# Patient Record
Sex: Female | Born: 1965 | Race: Asian | Hispanic: No | Marital: Married | Smoking: Never smoker
Health system: Southern US, Community
[De-identification: ages and names within clinical notes are randomized; demographics above are authoritative.]

## PROBLEM LIST (undated history)

## (undated) DIAGNOSIS — I10 Essential (primary) hypertension: Secondary | ICD-10-CM

---

## 2007-02-18 ENCOUNTER — Ambulatory Visit: Payer: Self-pay | Admitting: Internal Medicine

## 2007-02-18 LAB — CONVERTED CEMR LAB
ALT: 16 units/L (ref 0–35)
Albumin: 4 g/dL (ref 3.5–5.2)
Alkaline Phosphatase: 51 units/L (ref 39–117)
BUN: 12 mg/dL (ref 6–23)
Bilirubin Urine: NEGATIVE
Calcium: 9 mg/dL (ref 8.4–10.5)
Creatinine, Ser: 0.6 mg/dL (ref 0.4–1.2)
Direct LDL: 135.8 mg/dL
Eosinophils Absolute: 0 10*3/uL (ref 0.0–0.6)
HCT: 36.6 % (ref 36.0–46.0)
HDL: 52.9 mg/dL (ref >39.0)
Ketones, ur: NEGATIVE mg/dL
Leukocytes, UA: NEGATIVE
Monocytes Absolute: 0.4 10*3/uL (ref 0.2–0.7)
Monocytes Relative: 7 % (ref 3.0–11.0)
Neutro Abs: 3.7 10*3/uL (ref 1.4–7.7)
Neutrophils Relative %: 57.7 % (ref 43.0–77.0)
Nitrite: NEGATIVE
RDW: 13.9 % (ref 11.5–14.6)
Sodium: 137 meq/L (ref 135–145)
Specific Gravity, Urine: 1.02 (ref 1.000–1.03)
TSH: 0.87 microintl units/mL (ref 0.35–5.50)
Total CHOL/HDL Ratio: 3.8
Total Protein, Urine: NEGATIVE mg/dL
Triglycerides: 82 mg/dL (ref 0–149)
Urobilinogen, UA: 0.2 (ref 0.0–1.0)
WBC: 6.2 10*3/uL (ref 4.5–10.5)
pH: 6.5 (ref 5.0–8.0)

## 2007-02-25 ENCOUNTER — Ambulatory Visit: Payer: Self-pay | Admitting: Internal Medicine

## 2007-02-25 ENCOUNTER — Encounter (INDEPENDENT_AMBULATORY_CARE_PROVIDER_SITE_OTHER): Payer: Self-pay | Admitting: *Deleted

## 2007-02-25 DIAGNOSIS — I10 Essential (primary) hypertension: Secondary | ICD-10-CM | POA: Insufficient documentation

## 2007-03-01 ENCOUNTER — Ambulatory Visit (HOSPITAL_COMMUNITY): Admission: RE | Admit: 2007-03-01 | Discharge: 2007-03-01 | Payer: Self-pay | Admitting: Internal Medicine

## 2007-03-13 ENCOUNTER — Ambulatory Visit: Payer: Self-pay | Admitting: Internal Medicine

## 2007-03-19 LAB — CONVERTED CEMR LAB: Pap Smear: NORMAL

## 2007-03-22 LAB — CONVERTED CEMR LAB
CO2: 32 meq/L (ref 19–32)
Calcium: 9.3 mg/dL (ref 8.4–10.5)
Chloride: 102 meq/L (ref 96–112)
Creatinine, Ser: 0.6 mg/dL (ref 0.4–1.2)
GFR calc non Af Amer: 117 mL/min
Glucose, Bld: 94 mg/dL (ref 70–99)
Sodium: 141 meq/L (ref 135–145)

## 2007-03-25 ENCOUNTER — Ambulatory Visit: Payer: Self-pay | Admitting: Internal Medicine

## 2007-03-26 ENCOUNTER — Encounter: Payer: Self-pay | Admitting: Internal Medicine

## 2007-03-28 ENCOUNTER — Ambulatory Visit: Payer: Self-pay

## 2011-05-24 ENCOUNTER — Ambulatory Visit: Payer: Self-pay | Admitting: Internal Medicine

## 2012-12-31 ENCOUNTER — Other Ambulatory Visit: Payer: Self-pay | Admitting: Internal Medicine

## 2012-12-31 DIAGNOSIS — Z1231 Encounter for screening mammogram for malignant neoplasm of breast: Secondary | ICD-10-CM

## 2013-01-28 ENCOUNTER — Ambulatory Visit
Admission: RE | Admit: 2013-01-28 | Discharge: 2013-01-28 | Disposition: A | Discharge status: Home / Self Care | Payer: Medicaid Other | Source: Ambulatory Visit | Attending: Internal Medicine | Admitting: Internal Medicine

## 2013-01-28 DIAGNOSIS — Z1231 Encounter for screening mammogram for malignant neoplasm of breast: Secondary | ICD-10-CM

## 2014-03-30 ENCOUNTER — Other Ambulatory Visit: Payer: Self-pay | Admitting: Family Medicine

## 2014-03-30 DIAGNOSIS — Z1231 Encounter for screening mammogram for malignant neoplasm of breast: Secondary | ICD-10-CM

## 2014-04-06 ENCOUNTER — Ambulatory Visit (HOSPITAL_COMMUNITY)
Admission: RE | Admit: 2014-04-06 | Discharge: 2014-04-06 | Disposition: A | Discharge status: Home / Self Care | Payer: Medicaid Other | Source: Ambulatory Visit | Attending: Family Medicine | Admitting: Family Medicine

## 2014-04-06 ENCOUNTER — Ambulatory Visit (HOSPITAL_COMMUNITY): Payer: Medicaid Other

## 2014-04-06 DIAGNOSIS — Z1231 Encounter for screening mammogram for malignant neoplasm of breast: Secondary | ICD-10-CM | POA: Diagnosis not present

## 2016-12-03 ENCOUNTER — Encounter: Payer: Self-pay | Admitting: *Deleted

## 2016-12-03 DIAGNOSIS — Z23 Encounter for immunization: Secondary | ICD-10-CM

## 2018-09-20 ENCOUNTER — Other Ambulatory Visit: Payer: Self-pay | Admitting: Family Medicine

## 2018-09-20 DIAGNOSIS — Z1231 Encounter for screening mammogram for malignant neoplasm of breast: Secondary | ICD-10-CM

## 2018-11-24 ENCOUNTER — Encounter: Payer: Self-pay | Admitting: *Deleted

## 2018-11-24 ENCOUNTER — Other Ambulatory Visit: Payer: Self-pay | Admitting: *Deleted

## 2018-11-29 ENCOUNTER — Other Ambulatory Visit: Payer: Self-pay

## 2018-11-29 ENCOUNTER — Ambulatory Visit
Admission: RE | Admit: 2018-11-29 | Discharge: 2018-11-29 | Disposition: A | Discharge status: Home / Self Care | Payer: Medicaid Other | Source: Ambulatory Visit | Attending: Family Medicine | Admitting: Family Medicine

## 2018-11-29 DIAGNOSIS — Z1231 Encounter for screening mammogram for malignant neoplasm of breast: Secondary | ICD-10-CM

## 2019-02-15 ENCOUNTER — Emergency Department (HOSPITAL_BASED_OUTPATIENT_CLINIC_OR_DEPARTMENT_OTHER): Payer: Medicaid Other

## 2019-02-15 ENCOUNTER — Encounter (HOSPITAL_BASED_OUTPATIENT_CLINIC_OR_DEPARTMENT_OTHER): Payer: Self-pay | Admitting: Emergency Medicine

## 2019-02-15 ENCOUNTER — Emergency Department (HOSPITAL_BASED_OUTPATIENT_CLINIC_OR_DEPARTMENT_OTHER)
Admission: EM | Admit: 2019-02-15 | Discharge: 2019-02-15 | Disposition: A | Discharge status: Home / Self Care | Payer: Medicaid Other | Attending: Emergency Medicine | Admitting: Emergency Medicine

## 2019-02-15 ENCOUNTER — Other Ambulatory Visit: Payer: Self-pay

## 2019-02-15 DIAGNOSIS — S52124A Nondisplaced fracture of head of right radius, initial encounter for closed fracture: Secondary | ICD-10-CM | POA: Diagnosis not present

## 2019-02-15 DIAGNOSIS — Y939 Activity, unspecified: Secondary | ICD-10-CM | POA: Insufficient documentation

## 2019-02-15 DIAGNOSIS — I10 Essential (primary) hypertension: Secondary | ICD-10-CM | POA: Diagnosis not present

## 2019-02-15 DIAGNOSIS — Y999 Unspecified external cause status: Secondary | ICD-10-CM | POA: Insufficient documentation

## 2019-02-15 DIAGNOSIS — Y929 Unspecified place or not applicable: Secondary | ICD-10-CM | POA: Insufficient documentation

## 2019-02-15 DIAGNOSIS — W108XXA Fall (on) (from) other stairs and steps, initial encounter: Secondary | ICD-10-CM | POA: Insufficient documentation

## 2019-02-15 DIAGNOSIS — S59801A Other specified injuries of right elbow, initial encounter: Secondary | ICD-10-CM | POA: Diagnosis present

## 2019-02-15 HISTORY — DX: Essential (primary) hypertension: I10

## 2019-02-15 NOTE — ED Provider Notes (Signed)
[facility] EMERGENCY DEPARTMENT Provider Note   CSN: [id number] Arrival date & time: 02/15/19  1415     History   Chief Complaint Chief Complaint  Patient presents with   Fall    HPI Linda Holloway is a 53 y.o. female.     Patient presents to the emergency department today with complaint of right elbow pain and swelling.  Patient fell yesterday after missing a step and landed on a flexed right elbow.  Patient also sustained a mild abrasion to her knees but is able to walk and move well.  She developed worsening pain and swelling to the right elbow after the fall.  This prompted visit today.  No pain medications prior to arrival.  No distal numbness or tingling of the right hand or wrist.  No shoulder pain.  She did not hit her head and does not have any neck pain.  Onset of symptoms acute.  Pain is worse with movement.     Past Medical History:  Diagnosis Date   Hypertension     Patient Active Problem List   Diagnosis Date Noted   HYPERTENSION 02/25/2007    Past Surgical History:  Procedure Laterality Date   CESAREAN SECTION       OB History   No obstetric history on file.      Home Medications    Prior to Admission medications   Not on File    Family History History reviewed. No pertinent family history.  Social History Social History   Tobacco Use   Smoking status: Never Smoker  Substance Use Topics   Alcohol use: Not on file   Drug use: Not on file     Allergies   Patient has no allergy information on record.   Review of Systems Review of Systems  Constitutional: Negative for activity change.  Musculoskeletal: Positive for arthralgias and joint swelling. Negative for back pain, gait problem and neck pain.  Skin: Negative for wound.  Neurological: Negative for weakness and numbness.     Physical Exam Updated Vital Signs BP (!) 151/103 (BP Location: Left Arm)    Pulse (!) 102    Temp 99.2 F (37.3 C)    Resp 16    Ht 4\' 9"   (1.448 m)    Wt 58 kg    LMP 03/05/2014 Comment: states she is not pregnant   SpO2 100%    BMI 27.67 kg/m   Physical Exam Vitals signs and nursing note reviewed.  Constitutional:      Appearance: She is well-developed.  HENT:     Head: Normocephalic and atraumatic.  Eyes:     Pupils: Pupils are equal, round, and reactive to light.  Neck:     Musculoskeletal: Normal range of motion and neck supple.  Cardiovascular:     Pulses: Normal pulses. No decreased pulses.          Radial pulses are 2+ on the right side and 2+ on the left side.  Musculoskeletal:        General: Tenderness present.     Right shoulder: Normal.     Right elbow: She exhibits decreased range of motion and effusion. Tenderness found. Radial head tenderness noted. No medial epicondyle, no lateral epicondyle and no olecranon process tenderness noted.     Right wrist: Normal. She exhibits normal range of motion and no tenderness.       Arms:     Right hand: Normal sensation noted. Normal strength noted.  Skin:  General: Skin is warm and dry.  Neurological:     Mental Status: She is alert.     Sensory: No sensory deficit.     Comments: Motor, sensation, and vascular distal to the injury is fully intact.       ED Treatments / Results  Labs (all labs ordered are listed, but only abnormal results are displayed) Labs Reviewed - No data to display  EKG None  Radiology Dg Elbow Complete Right  Result Date: 02/15/2019 CLINICAL DATA:  Larey Seat yesterday on chronic treat injuring RIGHT elbow, posterior/olecranon pain EXAM: RIGHT ELBOW - COMPLETE 3+ VIEW COMPARISON:  None FINDINGS: Osseous mineralization normal. Joint spaces preserved. Minimally displaced intra-articular fracture of the RIGHT radial head. Associated moderate-sized RIGHT elbow joint effusion/hemarthrosis. No additional fracture, dislocation, or bone destruction. IMPRESSION: Minimally displaced intra-articular fracture of the RIGHT radial head associated  with moderate joint effusion/hemarthrosis. Electronically Signed   By: Ulyses Southward M.D.   On: 02/15/2019 14:59    Procedures Procedures (including critical care time)  Medications Ordered in ED Medications - No data to display   Initial Impression / Assessment and Plan / ED Course  I have reviewed the triage vital signs and the nursing notes.  Pertinent labs & imaging results that were available during my care of the patient were reviewed by me and considered in my medical decision making (see chart for details).        Patient seen and examined.  X-ray obtained prior to my evaluation.  This demonstrated radial head fracture.  Patient placed in a sling.  She has primary care doctor.  Encouraged patient to follow-up with PCP and or orthopedic referral.  Encouraged use of NSAIDs or Tylenol for pain.  Also encouraged rice protocol and patient has been icing her elbow in the room.  Vital signs reviewed and are as follows: BP (!) 142/78 (BP Location: Left Arm)    Pulse 89    Temp 99 F (37.2 C) (Oral)    Resp 16    Ht 4\' 9"  (1.448 m)    Wt 58 kg    LMP 03/05/2014 Comment: states she is not pregnant   SpO2 100%    BMI 27.67 kg/m     Final Clinical Impressions(s) / ED Diagnoses   Final diagnoses:  Closed nondisplaced fracture of head of right radius, initial encounter   Patient with minimally displaced right radial head fracture sustained after a fall yesterday.  No other significant injuries.  No signs of compartment syndrome or nerve compromise in the forearm, wrist, or hand.  Patient placed in a sling for comfort and will follow up with orthopedics or her primary care doctor to ensure proper healing.  ED Discharge Orders    None       03/07/2014, Renne Crigler 02/15/19 1624    14/05/20, MD 02/15/19 2229

## 2019-02-15 NOTE — ED Triage Notes (Signed)
Pts husband reports pt fell last night after missing a step and injured right elbow. Pt also c/o bilateral knee pain/abrasions

## 2019-02-15 NOTE — Discharge Instructions (Signed)
Please read and follow all provided instructions.  Your diagnoses today include:  1. Closed nondisplaced fracture of head of right radius, initial encounter     Tests performed today include:  An x-ray of the affected area - shows a closed radial head fracture  Vital signs. See below for your results today.   Medications prescribed:  Please use over-the-counter NSAID medications (ibuprofen, naproxen) or Tylenol (acetaminophen) as directed on the packaging for pain.   Take any prescribed medications only as directed.  Home care instructions:   Follow any educational materials contained in this packet  Follow R.I.C.E. Protocol:  R - rest your injury   I  - use ice on injury without applying directly to skin  C - compress injury with bandage or splint  E - elevate the injury as much as possible  Follow-up instructions: You should follow-up with either your primary care doctor or an orthopedist (referral listed -Dr. Lorin Mercy) next week to ensure appropriate wound healing.  Please use the sling as needed for comfort.  Return instructions:   Please return if your fingers are numb or tingling, appear gray or blue, or you have severe pain (also elevate the arm and loosen splint or wrap if you were given one)  Please return to the Emergency Department if you experience worsening symptoms.   Please return if you have any other emergent concerns.  Additional Information:  Your vital signs today were: BP (!) 175/95 (BP Location: Left Arm)    Pulse (!) 112    Temp 99.2 F (37.3 C)    Ht 4\' 9"  (1.448 m)    Wt 58 kg    LMP 03/05/2014 Comment: states she is not pregnant   SpO2 98%    BMI 27.67 kg/m  If your blood pressure (BP) was elevated above 135/85 this visit, please have this repeated by your doctor within one month. --------------

## 2019-02-18 ENCOUNTER — Ambulatory Visit (INDEPENDENT_AMBULATORY_CARE_PROVIDER_SITE_OTHER): Payer: Medicaid Other

## 2019-02-18 ENCOUNTER — Other Ambulatory Visit: Payer: Self-pay

## 2019-02-18 ENCOUNTER — Encounter: Payer: Self-pay | Admitting: Orthopaedic Surgery

## 2019-02-18 ENCOUNTER — Ambulatory Visit (INDEPENDENT_AMBULATORY_CARE_PROVIDER_SITE_OTHER): Payer: Medicaid Other | Admitting: Orthopaedic Surgery

## 2019-02-18 VITALS — BP 154/95 | HR 101 | Ht <= 58 in | Wt 127.0 lb

## 2019-02-18 DIAGNOSIS — S52121A Displaced fracture of head of right radius, initial encounter for closed fracture: Secondary | ICD-10-CM | POA: Diagnosis not present

## 2019-02-18 DIAGNOSIS — S52123A Displaced fracture of head of unspecified radius, initial encounter for closed fracture: Secondary | ICD-10-CM | POA: Insufficient documentation

## 2019-02-18 DIAGNOSIS — M25531 Pain in right wrist: Secondary | ICD-10-CM | POA: Diagnosis not present

## 2019-02-18 MED ORDER — TRAMADOL HCL 50 MG PO TABS: 50.0000 mg | ORAL_TABLET | Freq: Four times a day (QID) | ORAL | 0 refills | Status: AC | PRN

## 2019-02-18 NOTE — Progress Notes (Signed)
Office Visit Note   Patient: Linda Holloway           Date of Birth: 1966/03/11           MRN: [id number] Visit Date: 02/18/2019              Requested by: Shawna Orleans, Doe-Hyun R, DO No address on file PCP: Rosine Abe, DO   Assessment & Plan: Visit Diagnoses:  1. Pain in right wrist   2. Closed displaced fracture of head of right radius, initial encounter     Plan: X-rays of the wrist were negative.  We were discussed with her that she has some referred pain from her radial head fracture.  Continue sling we discussed she may be more comfortable sleeping in recliner.  Ultram sent in for pain that she can take in addition ibuprofen.  Recheck 2weeks.  We went over flexion-extension elbow exercises she can gradually work on.  No surgery indicated at this time.  Repeat x-rays in 2 weeks.  Follow-Up Instructions: Return in about 2 weeks (around 03/04/2019).   Orders:  Orders Placed This Encounter  Procedures  . XR Wrist Complete Right   Meds ordered this encounter  Medications  . traMADol (ULTRAM) 50 MG tablet    Sig: Take 1 tablet (50 mg total) by mouth every 6 (six) hours as needed.    Dispense:  30 tablet    Refill:  0      Procedures: No procedures performed   Clinical Data: No additional findings.   Subjective: Chief Complaint  Patient presents with  . Right Elbow - Fracture    DOI 02/14/2019    HPI 53 year old female missed a step on 02/14/2019 landing on her elbow with pain and minimally displaced radial head fracture, closed.  Involved one third of the head 1 mm depression.  She has been in the sling and has had some referred pain down to the wrist.  She is used Tylenol once she does not have any pain medication.  Patient is here with her husband who is speaking for her.  Extra time was spent going over exercise that she could do for her elbow for passive assistive range of motion using her opposite hand on her wrist.  We will make a decision about possible formal therapy  only if she needs it we will recheck her again in 2 weeks.  Review of Systems review of systems positive for hypertension, high cholesterol otherwise negative.  No past history of injury to her elbow no chills or fever no other injuries when she fell she denied loss of consciousness.   Objective: Vital Signs: BP (!) 154/95   Pulse (!) 101   Ht 4\' 9"  (1.448 m)   Wt 127 lb (57.6 kg)   LMP 03/05/2014 Comment: states she is not pregnant  BMI 27.48 kg/m   Physical Exam Constitutional:      Appearance: She is well-developed.  HENT:     Head: Normocephalic.     Right Ear: External ear normal.     Left Ear: External ear normal.  Eyes:     Pupils: Pupils are equal, round, and reactive to light.  Neck:     Thyroid: No thyromegaly.     Trachea: No tracheal deviation.  Cardiovascular:     Rate and Rhythm: Normal rate.  Pulmonary:     Effort: Pulmonary effort is normal.  Abdominal:     Palpations: Abdomen is soft.  Skin:    General: Skin  is warm and dry.  Neurological:     Mental Status: She is alert and oriented to person, place, and time.  Psychiatric:        Behavior: Behavior normal.     Ortho Exam right elbow shows elbow effusion.  She has supination pronation with discomfort.  60 degrees range of motion flexion extension with pain at extremes range of motion.  Elbow effusion is noted laterally and pain with palpation of the radial head as expected.  Specialty Comments:  No specialty comments available.  Imaging: Xr Wrist Complete Right  Result Date: 02/18/2019 Three-view x-rays right wrist obtained AP lateral scaphoid view.  This is negative for acute injury. Impression: Normal right wrist x-rays.    PMFS History: Patient Active Problem List   Diagnosis Date Noted  . Radial head fracture, closed 02/18/2019  . HYPERTENSION 02/25/2007   Past Medical History:  Diagnosis Date  . Hypertension     No family history on file.  Past Surgical History:  Procedure  Laterality Date  . CESAREAN SECTION     Social History   Occupational History  . Not on file  Tobacco Use  . Smoking status: Never Smoker  . Smokeless tobacco: Never Used  Substance and Sexual Activity  . Alcohol use: Not on file  . Drug use: Not on file  . Sexual activity: Not on file

## 2019-02-19 ENCOUNTER — Telehealth: Payer: Self-pay | Admitting: Radiology

## 2019-02-19 NOTE — Telephone Encounter (Signed)
Submitted prior auth to [employer] for Tramadol   Form HelpConfirmation #:[id number] [employer] Approval #:Status:SUSPENDED Thank you. Your request has been successfully submitted.  Will check back for authorization.

## 2019-02-21 NOTE — Telephone Encounter (Signed)
Attempt to recheck [facility]. Portal down.

## 2019-02-24 NOTE — Telephone Encounter (Signed)
I called patient, no answer. Will address at follow up appt unless she calls back.

## 2019-02-24 NOTE — Telephone Encounter (Signed)
I called [facility] this morning to see if patient received her medication. No answer, no voicemail picked up. Will try again.

## 2019-03-04 ENCOUNTER — Encounter: Payer: Self-pay | Admitting: Orthopaedic Surgery

## 2019-03-04 ENCOUNTER — Ambulatory Visit: Payer: Self-pay

## 2019-03-04 ENCOUNTER — Ambulatory Visit (INDEPENDENT_AMBULATORY_CARE_PROVIDER_SITE_OTHER): Payer: Medicaid Other | Admitting: Orthopaedic Surgery

## 2019-03-04 ENCOUNTER — Other Ambulatory Visit: Payer: Self-pay

## 2019-03-04 VITALS — BP 150/95 | HR 106 | Ht <= 58 in | Wt 127.0 lb

## 2019-03-04 DIAGNOSIS — M25511 Pain in right shoulder: Secondary | ICD-10-CM

## 2019-03-04 DIAGNOSIS — S52121A Displaced fracture of head of right radius, initial encounter for closed fracture: Secondary | ICD-10-CM | POA: Diagnosis not present

## 2019-03-04 NOTE — Progress Notes (Signed)
Office Visit Note   Patient: Linda Holloway           Date of Birth: 03-Nov-1965           MRN: [id number] Visit Date: 03/04/2019              Requested by: Artist Pais, Doe-Hyun R, DO No address on file PCP: Meda Coffee, DO   Assessment & Plan: Visit Diagnoses:  1. Closed displaced fracture of head of right radius, initial encounter   2. Acute pain of right shoulder     Plan: Post shoulder injury with some early adhesive capsulitis.  We will set up for some physical therapy to work on range of motion active assistive range of motion.  We discussed lying down using a stick in both hands trying to gradually work her arm overhead use of a pulley at home on a rope over the top of the door.  Active assistive range of motion using her opposite arm to help stretch her shoulder.  At this point injection was deferred.  We will recheck her in 2 weeks.  Follow-Up Instructions: Return in about 2 weeks (around 03/18/2019).   Orders:  Orders Placed This Encounter  Procedures  . XR Elbow 2 Views Right  . XR Shoulder Right  . Ambulatory referral to Physical Therapy   No orders of the defined types were placed in this encounter.     Procedures: No procedures performed   Clinical Data: No additional findings.   Subjective: Chief Complaint  Patient presents with  . Right Elbow - Follow-up, Fracture    HPI 53 year old female returns for follow-up of minimally displaced radial head fracture.  Fracture involves almost the third of the joint there is about 1 mm depression patient uses a sling intermittently and some tramadol occasionally for pain.  She can reach near full extension flexion and does not have pain with elbow range of motion.  She has had problems with her right shoulder however and has decreased range of motion since she fell and injured her elbow.  When she tries to get her arm up into the air she has sharp pain and tightness.  No past history of injury to the shoulder.  Denies neck  pain no numbness or tingling in her hand.  Review of Systems updated noncontributory positive only for hypertension.  Family is here interpreting.   Objective: Vital Signs: BP (!) 150/95   Pulse (!) 106   Ht 4\' 9"  (1.448 m)   Wt 127 lb (57.6 kg)   LMP 03/05/2014 Comment: states she is not pregnant  BMI 27.48 kg/m   Physical Exam Constitutional:      Appearance: She is well-developed.  HENT:     Head: Normocephalic.     Right Ear: External ear normal.     Left Ear: External ear normal.  Eyes:     Pupils: Pupils are equal, round, and reactive to light.  Neck:     Thyroid: No thyromegaly.     Trachea: No tracheal deviation.  Cardiovascular:     Rate and Rhythm: Normal rate.  Pulmonary:     Effort: Pulmonary effort is normal.  Abdominal:     Palpations: Abdomen is soft.  Skin:    General: Skin is warm and dry.  Neurological:     Mental Status: She is alert and oriented to person, place, and time.  Psychiatric:        Behavior: Behavior normal.     Ortho Exam  patient has abduction of the shoulder only to 90 degrees with some tightness and pain.  Long head of the biceps is intact no supraspinatus atrophy.  Limitation of external rotation of the shoulder as well as internal rotation arm to internal rotated position with hand at mid axillary line.  Specialty Comments:  No specialty comments available.  Imaging: No results found.   PMFS History: Patient Active Problem List   Diagnosis Date Noted  . Radial head fracture, closed 02/18/2019  . HYPERTENSION 02/25/2007   Past Medical History:  Diagnosis Date  . Hypertension     No family history on file.  Past Surgical History:  Procedure Laterality Date  . CESAREAN SECTION     Social History   Occupational History  . Not on file  Tobacco Use  . Smoking status: Never Smoker  . Smokeless tobacco: Never Used  Substance and Sexual Activity  . Alcohol use: Not on file  . Drug use: Not on file  . Sexual  activity: Not on file

## 2019-03-18 ENCOUNTER — Other Ambulatory Visit: Payer: Self-pay

## 2019-03-18 ENCOUNTER — Encounter: Payer: Self-pay | Admitting: Orthopaedic Surgery

## 2019-03-18 ENCOUNTER — Ambulatory Visit (INDEPENDENT_AMBULATORY_CARE_PROVIDER_SITE_OTHER): Payer: Medicaid Other | Admitting: Orthopaedic Surgery

## 2019-03-18 VITALS — Ht <= 58 in | Wt 127.0 lb

## 2019-03-18 DIAGNOSIS — M25511 Pain in right shoulder: Secondary | ICD-10-CM | POA: Insufficient documentation

## 2019-03-18 DIAGNOSIS — S52121A Displaced fracture of head of right radius, initial encounter for closed fracture: Secondary | ICD-10-CM

## 2019-03-18 NOTE — Progress Notes (Signed)
Office Visit Note   Patient: Linda Holloway           Date of Birth: 1965/08/24           MRN: [id number] Visit Date: 03/18/2019              Requested by: Artist Pais, Doe-Hyun R, DO No address on file PCP: Meda Coffee, DO   Assessment & Plan: Visit Diagnoses:  1. Closed displaced fracture of head of right radius, initial encounter   2. Right shoulder pain, unspecified chronicity     Plan: Patient to resume tasks work activities at home.  Shoulder range of motion is improved with stretching exercises she is done on her own.  She does not need formal physical therapy at this point and can continue stretching.  Elbow is doing well without pain and office follow-up will be as needed.  Follow-Up Instructions: No follow-ups on file.   Orders:  No orders of the defined types were placed in this encounter.  No orders of the defined types were placed in this encounter.     Procedures: No procedures performed   Clinical Data: No additional findings.   Subjective: Chief Complaint  Patient presents with  . Right Shoulder - Follow-up  . Right Elbow - Follow-up, Fracture    HPI 54 year old female returns with her husband for follow-up of radial head fracture as well as some early adhesive capsulitis of the shoulder.  Physical therapy was ordered but patient did not go to therapy and did her exercises on her own.  She is worked on abduction and fingertip while walking and only lacks 10 degrees with symmetrical shoulder flexion.  She states her elbow is not hurting her and she has only minimal discomfort in her shoulder currently.  Review of Systems 14 point update unchanged from last office visit other than as mentioned HPI.   Objective: Vital Signs: Ht 4\' 9"  (1.448 m)   Wt 127 lb (57.6 kg)   LMP 03/05/2014 Comment: states she is not pregnant  BMI 27.48 kg/m   Physical Exam Constitutional:      Appearance: She is well-developed.  HENT:     Head: Normocephalic.     Right Ear:  External ear normal.     Left Ear: External ear normal.  Eyes:     Pupils: Pupils are equal, round, and reactive to light.  Neck:     Thyroid: No thyromegaly.     Trachea: No tracheal deviation.  Cardiovascular:     Rate and Rhythm: Normal rate.  Pulmonary:     Effort: Pulmonary effort is normal.  Abdominal:     Palpations: Abdomen is soft.  Skin:    General: Skin is warm and dry.  Neurological:     Mental Status: She is alert and oriented to person, place, and time.  Psychiatric:        Behavior: Behavior normal.     Ortho Exam patient lacks 15 degrees of flexion right shoulder compared to the normal left.  Internal rotation to posterior axillary line.  Elbow reaches full extension flexion with thumb to shoulder.  No swelling or tenderness of the elbow collateral ligaments are stable.  Specialty Comments:  No specialty comments available.  Imaging: No results found.   PMFS History: Patient Active Problem List   Diagnosis Date Noted  . Pain in right shoulder 03/18/2019  . Radial head fracture, closed 02/18/2019  . HYPERTENSION 02/25/2007   Past Medical History:  Diagnosis Date  .  Hypertension     No family history on file.  Past Surgical History:  Procedure Laterality Date  . CESAREAN SECTION     Social History   Occupational History  . Not on file  Tobacco Use  . Smoking status: Never Smoker  . Smokeless tobacco: Never Used  Substance and Sexual Activity  . Alcohol use: Not on file  . Drug use: Not on file  . Sexual activity: Not on file

## 2019-03-20 ENCOUNTER — Ambulatory Visit: Payer: Medicaid Other

## 2021-02-05 IMAGING — DX DG ELBOW COMPLETE 3+V*R*
4 series · 4 of 4 positions shown · non-contrast
Comparison: None

CLINICAL DATA: Fell yesterday on chronic treat injuring RIGHT
elbow, posterior/olecranon pain

EXAM:
RIGHT ELBOW - COMPLETE 3+ VIEW

[elbow ap]
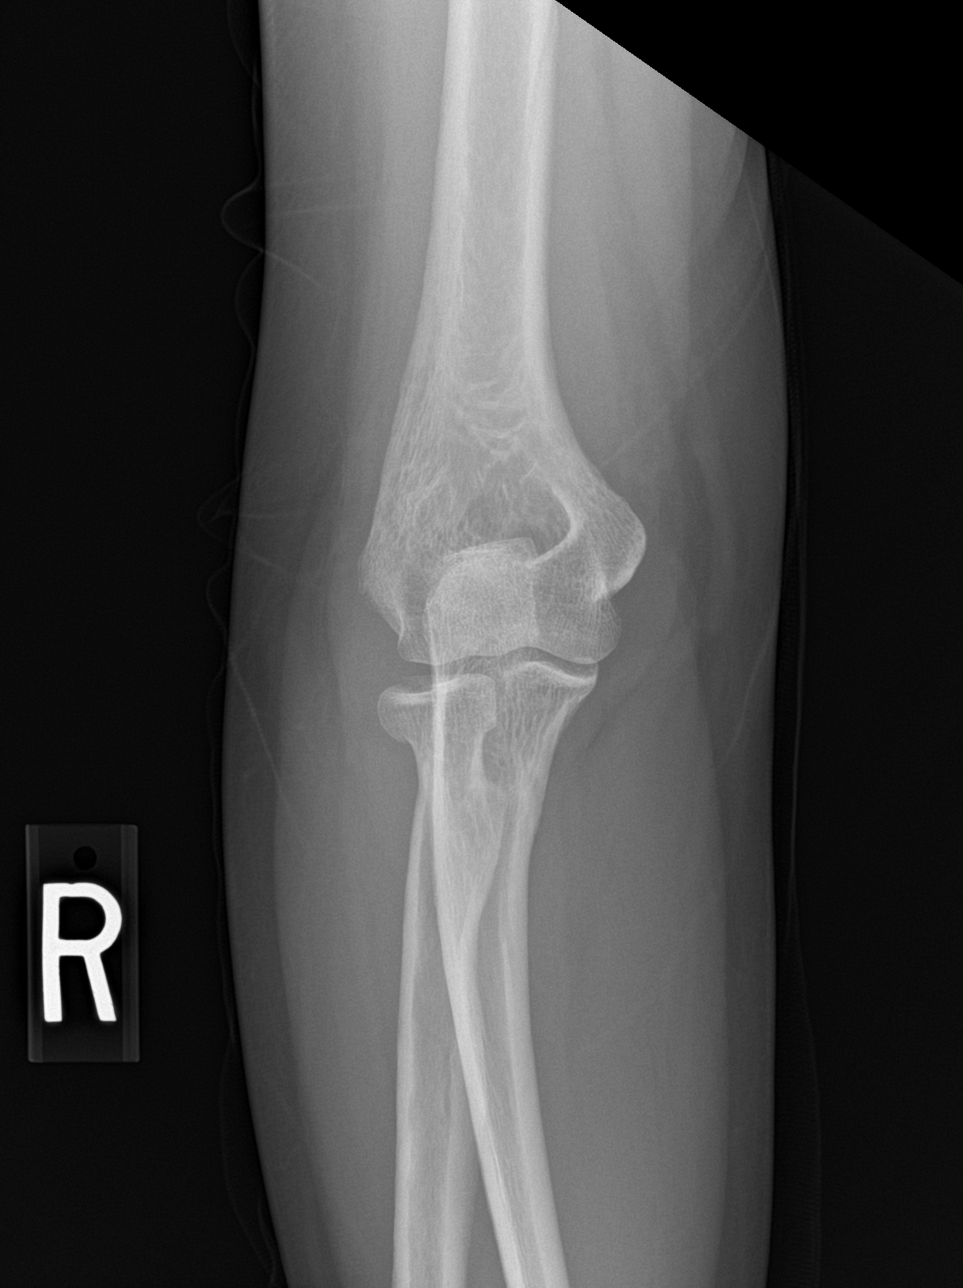

[elbow obl (1 of 2)]
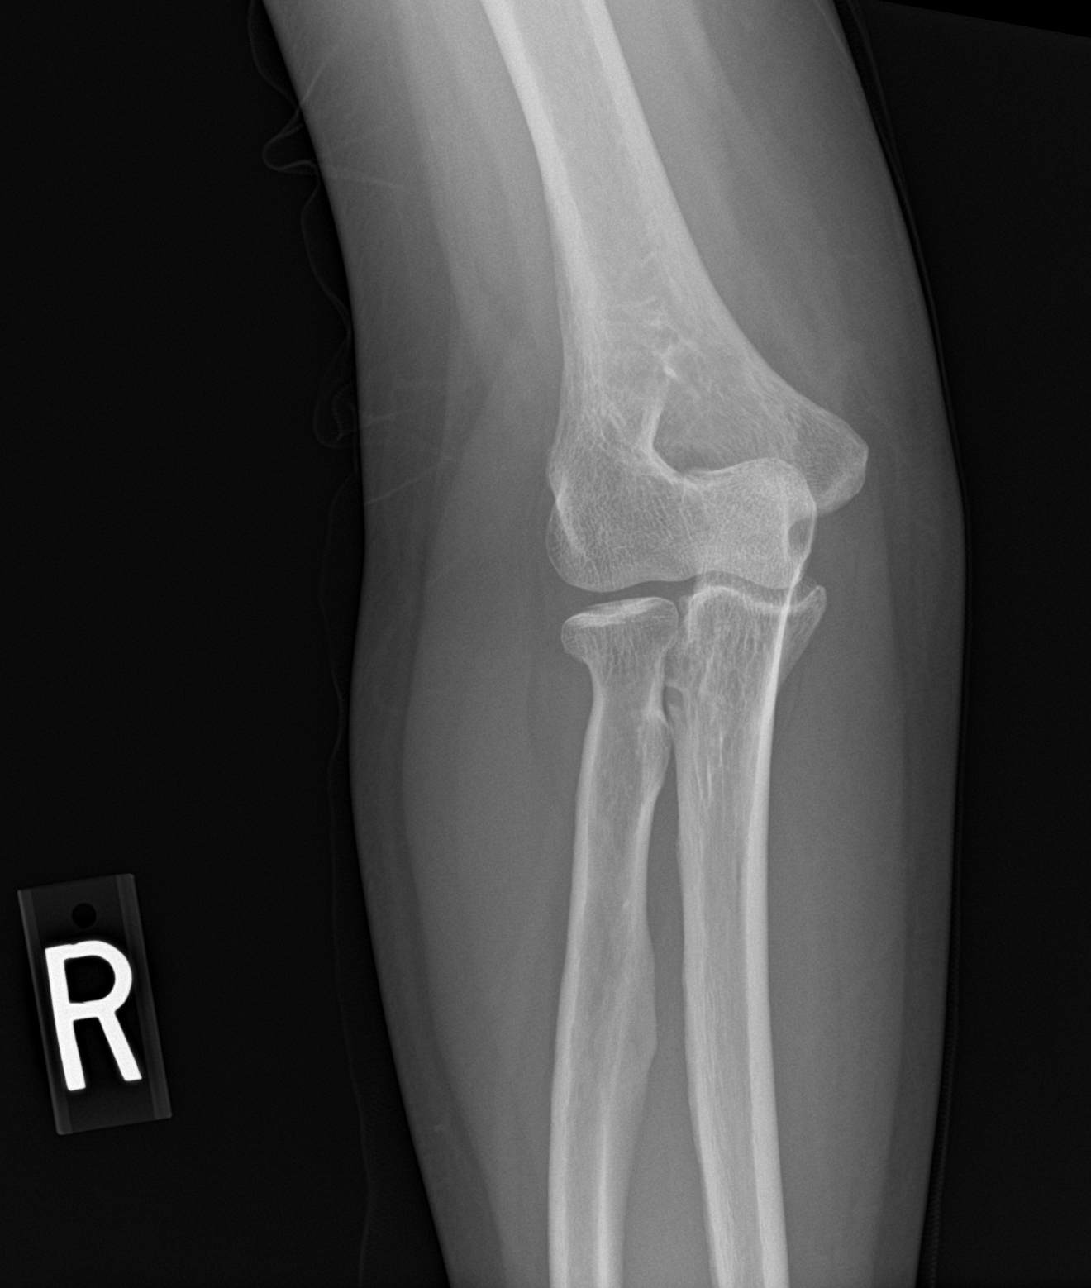

[elbow lat]
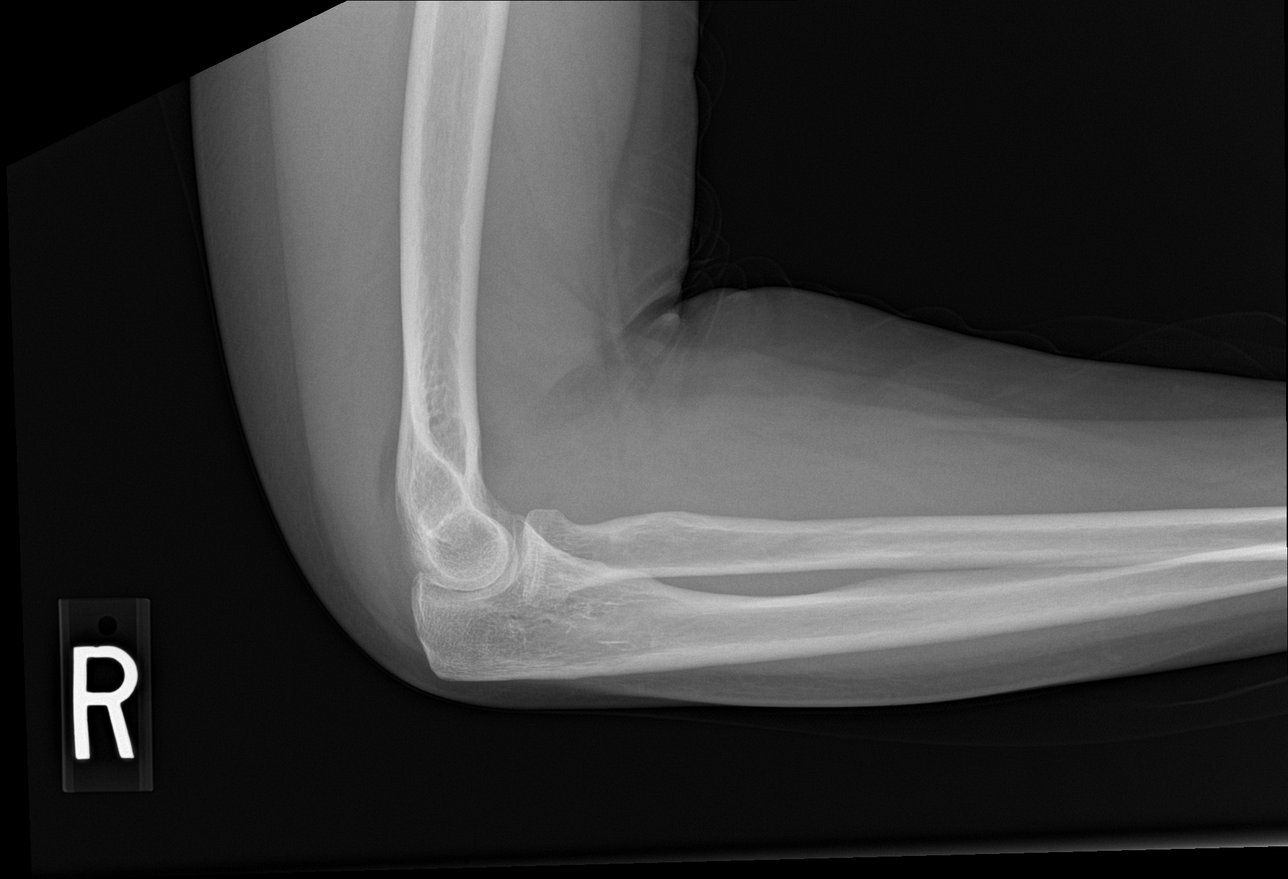

[elbow obl (2 of 2)]
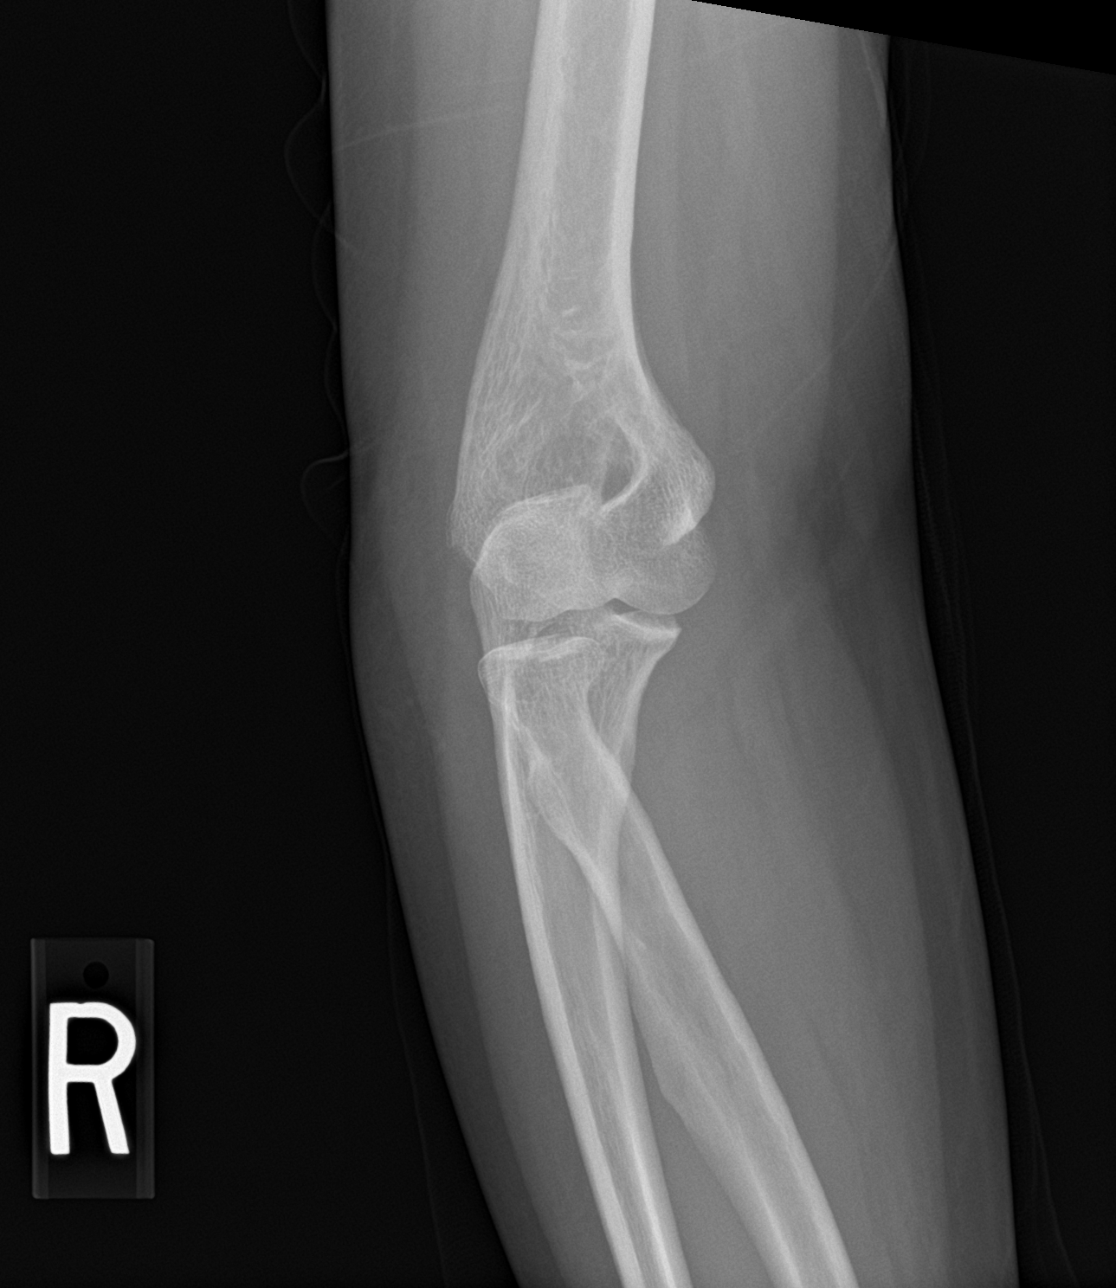

[4 of 4 positions shown; findings below may reference images not displayed]

FINDINGS: Osseous mineralization normal.

Joint spaces preserved.

Minimally displaced intra-articular fracture of the RIGHT radial
head.

Associated moderate-sized RIGHT elbow joint effusion/hemarthrosis.

No additional fracture, dislocation, or bone destruction.
IMPRESSION: Minimally displaced intra-articular fracture of the RIGHT radial
head associated with moderate joint effusion/hemarthrosis.
